# Patient Record
Sex: Male | Born: 1970 | Race: White | Hispanic: No | Marital: Single | Smoking: Former smoker
Health system: Southern US, Community
[De-identification: ages and names within clinical notes are randomized; demographics above are authoritative.]

## PROBLEM LIST (undated history)

## (undated) DIAGNOSIS — J449 Chronic obstructive pulmonary disease, unspecified: Secondary | ICD-10-CM

## (undated) DIAGNOSIS — E119 Type 2 diabetes mellitus without complications: Secondary | ICD-10-CM

## (undated) HISTORY — PX: APPENDECTOMY: SHX54

## (undated) HISTORY — PX: KNEE SURGERY: SHX244

## (undated) HISTORY — DX: Chronic obstructive pulmonary disease, unspecified: J44.9

---

## 2001-01-19 ENCOUNTER — Encounter: Payer: Self-pay | Admitting: Emergency Medicine

## 2001-01-19 ENCOUNTER — Emergency Department (HOSPITAL_COMMUNITY): Admission: EM | Admit: 2001-01-19 | Discharge: 2001-01-19 | Payer: Self-pay | Admitting: Emergency Medicine

## 2001-03-17 ENCOUNTER — Ambulatory Visit (HOSPITAL_BASED_OUTPATIENT_CLINIC_OR_DEPARTMENT_OTHER): Admission: RE | Admit: 2001-03-17 | Discharge: 2001-03-17 | Payer: Self-pay | Admitting: Orthopedic Surgery

## 2001-03-28 ENCOUNTER — Encounter: Admission: RE | Admit: 2001-03-28 | Discharge: 2001-03-28 | Payer: Self-pay | Admitting: Family Medicine

## 2001-07-21 ENCOUNTER — Encounter: Admission: RE | Admit: 2001-07-21 | Discharge: 2001-07-21 | Payer: Self-pay | Admitting: Family Medicine

## 2001-12-09 ENCOUNTER — Emergency Department (HOSPITAL_COMMUNITY): Admission: EM | Admit: 2001-12-09 | Discharge: 2001-12-09 | Payer: Self-pay | Admitting: Emergency Medicine

## 2001-12-09 ENCOUNTER — Encounter: Payer: Self-pay | Admitting: Emergency Medicine

## 2003-08-17 ENCOUNTER — Encounter: Admission: RE | Admit: 2003-08-17 | Discharge: 2003-08-17 | Payer: Self-pay | Admitting: Sports Medicine

## 2003-08-17 ENCOUNTER — Encounter: Admission: RE | Admit: 2003-08-17 | Discharge: 2003-08-17 | Payer: Self-pay | Admitting: Family Medicine

## 2003-09-04 ENCOUNTER — Observation Stay (HOSPITAL_COMMUNITY): Admission: EM | Admit: 2003-09-04 | Discharge: 2003-09-04 | Payer: Self-pay | Admitting: Family Medicine

## 2003-10-01 ENCOUNTER — Encounter: Admission: RE | Admit: 2003-10-01 | Discharge: 2003-10-01 | Payer: Self-pay | Admitting: Family Medicine

## 2003-12-06 ENCOUNTER — Encounter: Admission: RE | Admit: 2003-12-06 | Discharge: 2003-12-06 | Payer: Self-pay | Admitting: Family Medicine

## 2004-03-10 ENCOUNTER — Ambulatory Visit: Payer: Self-pay | Admitting: Family Medicine

## 2004-05-17 ENCOUNTER — Encounter: Admission: RE | Admit: 2004-05-17 | Discharge: 2004-05-17 | Payer: Self-pay | Admitting: Family Medicine

## 2004-05-17 ENCOUNTER — Ambulatory Visit: Payer: Self-pay | Admitting: Sports Medicine

## 2004-06-07 ENCOUNTER — Ambulatory Visit: Payer: Self-pay | Admitting: Sports Medicine

## 2004-07-05 ENCOUNTER — Ambulatory Visit: Payer: Self-pay | Admitting: Sports Medicine

## 2004-08-16 ENCOUNTER — Ambulatory Visit: Payer: Self-pay | Admitting: Sports Medicine

## 2005-02-12 ENCOUNTER — Ambulatory Visit: Payer: Self-pay | Admitting: Family Medicine

## 2005-04-27 ENCOUNTER — Ambulatory Visit: Payer: Self-pay | Admitting: Family Medicine

## 2005-04-27 ENCOUNTER — Encounter: Admission: RE | Admit: 2005-04-27 | Discharge: 2005-04-27 | Payer: Self-pay | Admitting: Family Medicine

## 2005-07-02 ENCOUNTER — Ambulatory Visit: Payer: Self-pay | Admitting: Family Medicine

## 2005-08-09 ENCOUNTER — Ambulatory Visit: Payer: Self-pay | Admitting: Family Medicine

## 2005-09-14 ENCOUNTER — Ambulatory Visit: Payer: Self-pay | Admitting: Family Medicine

## 2005-10-19 ENCOUNTER — Ambulatory Visit: Payer: Self-pay | Admitting: Family Medicine

## 2006-07-11 DIAGNOSIS — G5600 Carpal tunnel syndrome, unspecified upper limb: Secondary | ICD-10-CM

## 2006-07-11 DIAGNOSIS — F329 Major depressive disorder, single episode, unspecified: Secondary | ICD-10-CM

## 2006-07-11 DIAGNOSIS — E669 Obesity, unspecified: Secondary | ICD-10-CM | POA: Insufficient documentation

## 2006-07-11 DIAGNOSIS — F1010 Alcohol abuse, uncomplicated: Secondary | ICD-10-CM | POA: Insufficient documentation

## 2006-07-11 DIAGNOSIS — F17200 Nicotine dependence, unspecified, uncomplicated: Secondary | ICD-10-CM | POA: Insufficient documentation

## 2006-12-16 IMAGING — CR DG FOOT COMPLETE 3+V*L*
3 series · 3 of 3 positions shown · non-contrast
Comparison: none

CLINICAL DATA: Injury left foot.  Pain os calcis region.  
 LEFT FOOT COMPLETE:
 Three views of the left foot show no evidence of fracture, dislocation, or foreign body.  There is small anterior inferior calcaneal spur.

[view not recorded (1 of 3)]
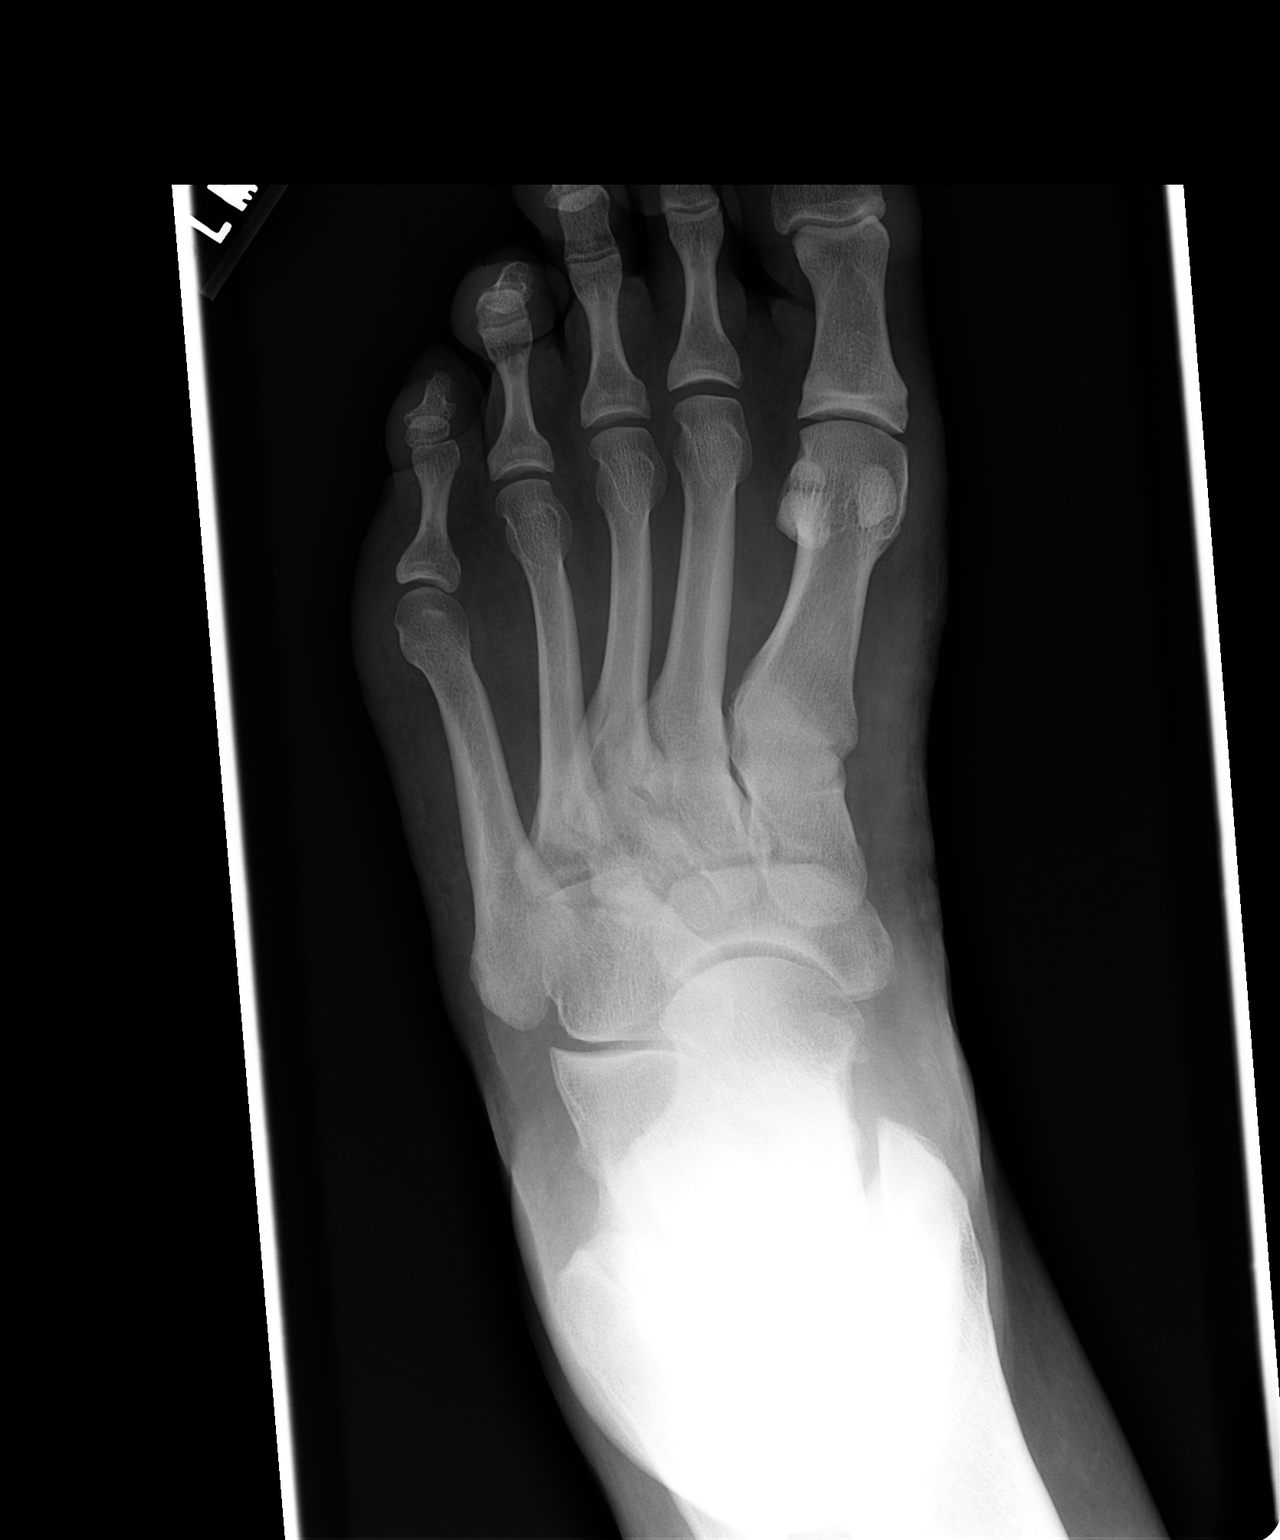

[view not recorded (2 of 3)]
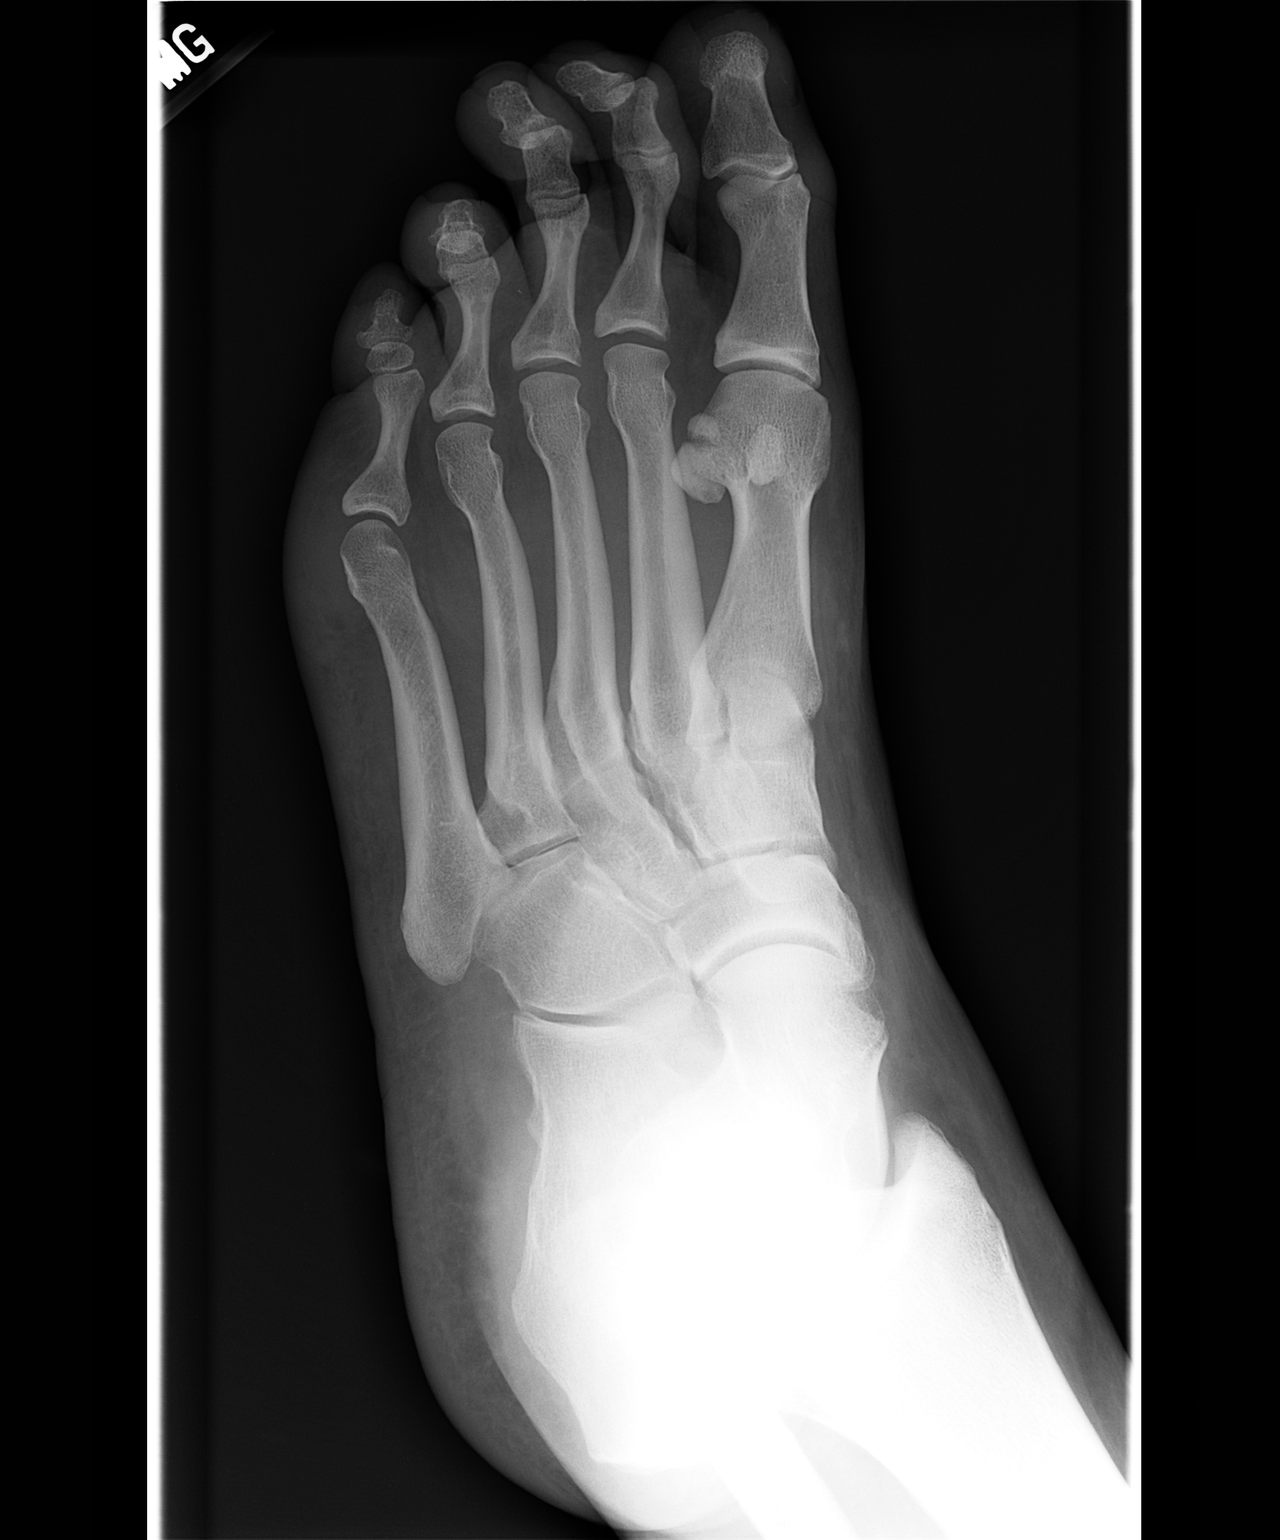

[view not recorded (3 of 3)]
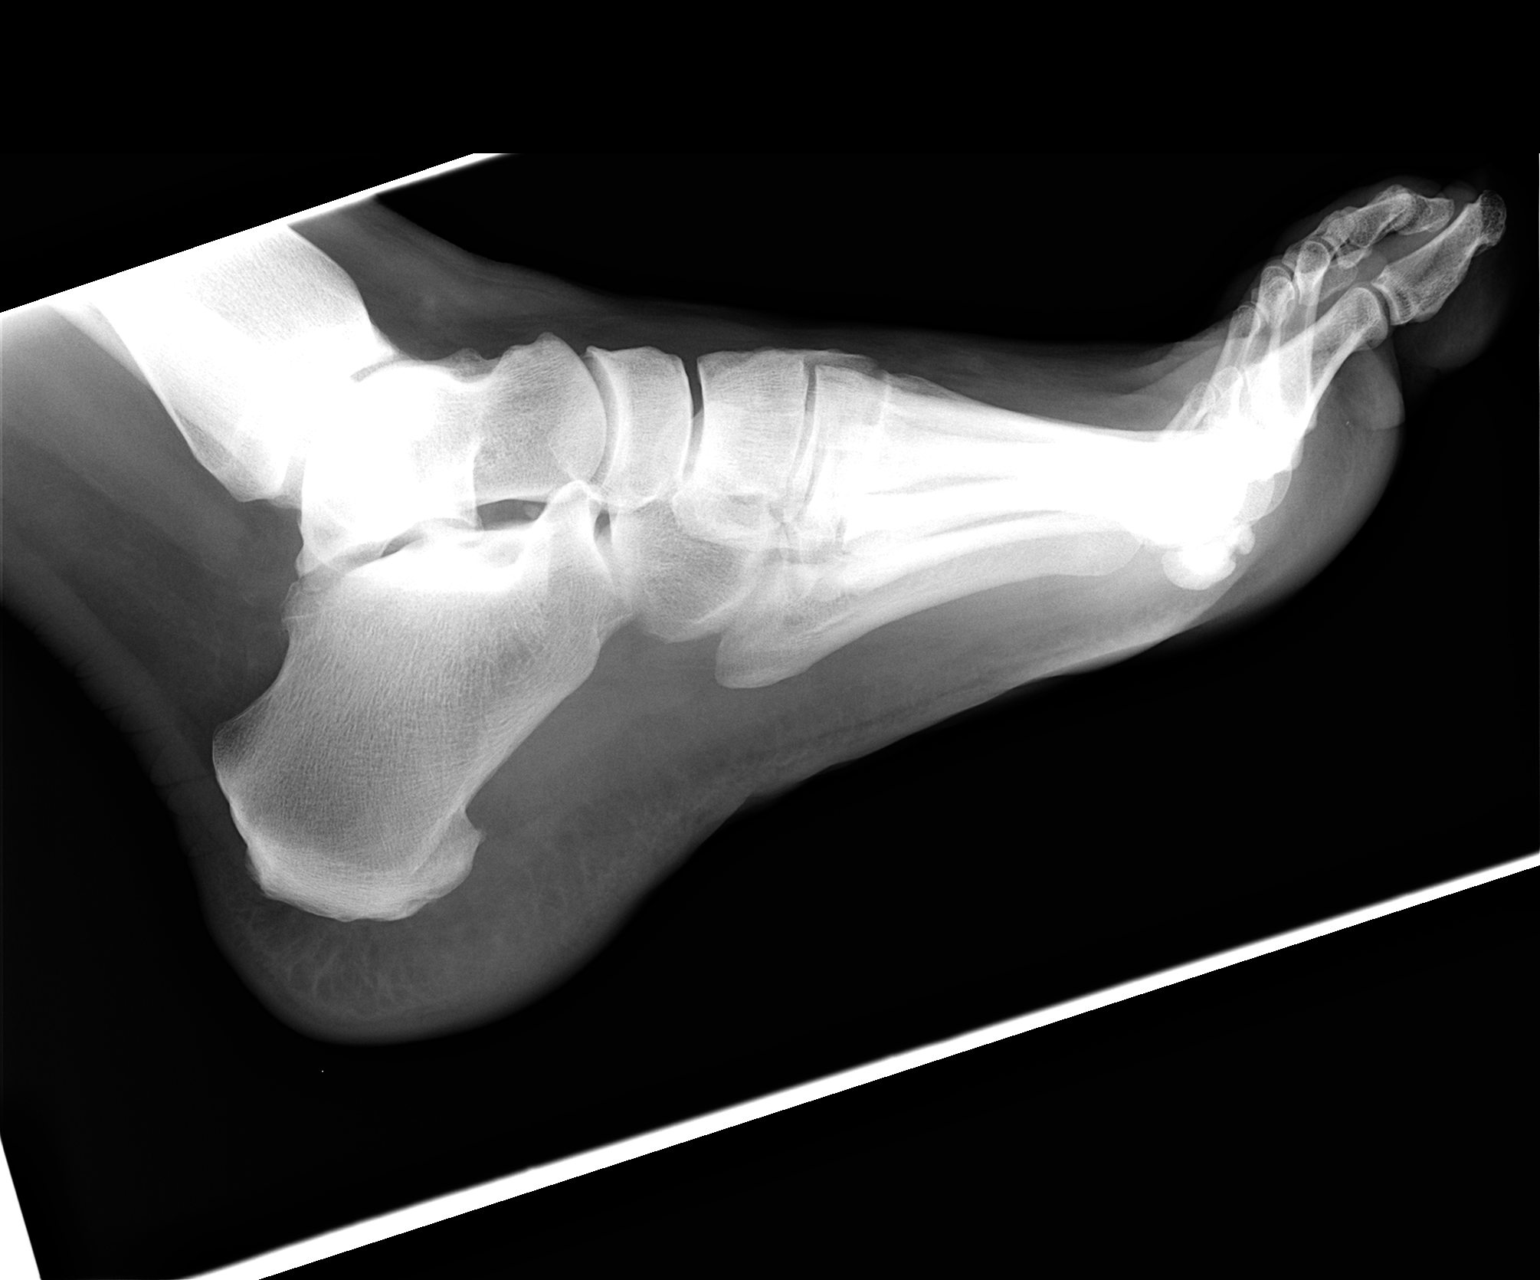

[3 of 3 positions shown; findings below may reference images not displayed]

IMPRESSION: No evidence of fracture, dislocation, or radiopaque foreign body.  Small anterior inferior calcaneal spur.

## 2007-10-24 ENCOUNTER — Ambulatory Visit: Payer: Self-pay | Admitting: Family Medicine

## 2007-10-24 DIAGNOSIS — J309 Allergic rhinitis, unspecified: Secondary | ICD-10-CM | POA: Insufficient documentation

## 2008-02-03 ENCOUNTER — Ambulatory Visit: Payer: Self-pay | Admitting: Family Medicine

## 2008-02-03 ENCOUNTER — Telehealth: Payer: Self-pay | Admitting: *Deleted

## 2008-02-03 DIAGNOSIS — J069 Acute upper respiratory infection, unspecified: Secondary | ICD-10-CM | POA: Insufficient documentation

## 2008-04-14 ENCOUNTER — Emergency Department (HOSPITAL_COMMUNITY): Admission: EM | Admit: 2008-04-14 | Discharge: 2008-04-14 | Payer: Self-pay | Admitting: Emergency Medicine

## 2008-08-20 ENCOUNTER — Ambulatory Visit: Payer: Self-pay | Admitting: Family Medicine

## 2008-08-20 DIAGNOSIS — J45909 Unspecified asthma, uncomplicated: Secondary | ICD-10-CM | POA: Insufficient documentation

## 2008-10-26 ENCOUNTER — Telehealth: Payer: Self-pay | Admitting: Family Medicine

## 2008-10-27 ENCOUNTER — Ambulatory Visit: Payer: Self-pay | Admitting: Family Medicine

## 2008-10-27 ENCOUNTER — Encounter: Admission: RE | Admit: 2008-10-27 | Discharge: 2008-10-27 | Payer: Self-pay | Admitting: Family Medicine

## 2008-10-27 DIAGNOSIS — R05 Cough: Secondary | ICD-10-CM

## 2008-12-19 ENCOUNTER — Emergency Department (HOSPITAL_COMMUNITY): Admission: EM | Admit: 2008-12-19 | Discharge: 2008-12-19 | Payer: Self-pay | Admitting: Emergency Medicine

## 2008-12-21 ENCOUNTER — Emergency Department (HOSPITAL_COMMUNITY): Admission: EM | Admit: 2008-12-21 | Discharge: 2008-12-21 | Payer: Self-pay | Admitting: Emergency Medicine

## 2009-04-04 ENCOUNTER — Emergency Department (HOSPITAL_COMMUNITY): Admission: EM | Admit: 2009-04-04 | Discharge: 2009-04-04 | Payer: Self-pay | Admitting: Emergency Medicine

## 2010-02-02 ENCOUNTER — Encounter: Payer: Self-pay | Admitting: Family Medicine

## 2010-06-13 NOTE — Miscellaneous (Signed)
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 

## 2010-06-15 ENCOUNTER — Encounter: Payer: Self-pay | Admitting: *Deleted

## 2010-09-29 NOTE — Op Note (Signed)
[place]  Patient:    SHELL, YANDOW Visit Number: [id number] MRN: [id number]          Service Type: EMS Location: [facility] Attending Physician:  Osvaldo Human Dictated by:   Alinda Deem, M.D. Proc. Date: 03/03/01 Admit Date:  01/19/2001 Discharge Date: 01/19/2001                             Operative Report  PREOPERATIVE DIAGNOSIS:  Status post right knee patellar dislocation with medial meniscus tear.  POSTOPERATIVE DIAGNOSIS:  Status post right knee patellar dislocation with medial meniscus tear.  OPERATION PERFORMED:  Right knee arthroscopic partial medial meniscectomy, lateral retinacular release and an open medial reefing.  SURGEON:  Alinda Deem, M.D.  ASSISTANT: Dorthula Matas, P.A.-C.  ANESTHESIA:  General endotracheal.  ESTIMATED BLOOD LOSS:  Minimal.  FLUID REPLACEMENT:  1L crystalloid.  DRAINS:  None.  TOURNIQUET TIME:   About 30 minutes.  INDICATIONS FOR PROCEDURE:  The patient is a 40 year old man who sustained a patellar dislocation some months ago with persistent pain, MRI scan showing complete tear of the medial retinaculum and lateral displacement of the patella.  He also had a medial meniscus tear.  Because of persistent pain he is taken for arthroscopic removal of the medial meniscus tear, lateral retinacular release and then repair of the medial retinacular tear.  DESCRIPTION OF PROCEDURE:  The patient was identified by arm band and taken to the operating room at [facility] where the appropriate anesthetic monitors were attached and general endotracheal anesthesia induced with the patient in the supine position.  A lateral post was applied to the table and the right lower extremity prepped and draped in the usual sterile fashion from the ankle to the tourniquet high on the right thigh.  We began the procedure by infiltrating the inferomedial and inferolateral peripatellar  regions with 2 to 3 cc of 0.5% Marcaine with epinephrine solution allowing introduction of the arthroscope through the inferolateral portal and the outflow through the inferomedial portal.  We immediately identified the medial retinacular tear which was complete with lateral subluxation of the patella to the lateral side of the lateral femoral condyle with the knee in extension.  Moving into the medial compartment we identified the medial meniscus tear which was a complex tear of the posterior horn all the way around medially and this was removed with straight biters and a 4.2 Great White sucker shaver.  We also removed an incidental plica to better visualize the patella which did have some very mild chondromalacia.  Lateral retinacular release was deferred to be performed during the open part of the procedure.  The lateral compartment was in excellent condition and so were the cruciate ligaments.  At this point the arthroscopic instruments were removed.  The limb was wrapped with an Esmarch bandage, tourniquet inflated to 350 mmHg and we made a medial parapatellar approach to the knee joint through the skin and subcutaneous tissues just lateral to the medial edge of the patella.  A longitudinal incision about 12 cm in length.  Small bleeders in the skin and subcutaneous tissues identified and cauterized.  The transverse retinaculum was identified and divided and reflected medially exposing the VMO as it came into the patella as well as some abundant scar tissue from the dislocation.  We then made a parapatellar arthrotomy and removed about a quarter inch of scar tissue leaving a cuff  of tendon on either side of the incision.  Because there was some tightness to the lateral retinaculum at this point, we performed an open lateral retinacular release with a #10 blade allowing the patella to center and go to just the medial side of center without difficulty.  The wound was then washed out with  normal saline solution and the medial VMO insertion on the medial retinaculum was closed with running #1 Vicryl suture.  Subcutaneous tissue closed with 0 and 2-0 undyed Vicryl sutures and the skin with running interlocking 3-0 nylon suture.  Prior to the closure the tourniquet was let down to identify and cauterize small bleeders.  A dressing of Xeroform, 4 x 4 dressing sponges, Webril and an Ace wrap and a knee immobilizer applied.  The patient was awakened and taken to the recovery room without difficulty. Dictated by:   Alinda Deem, M.D. Attending Physician:  Osvaldo Human DD:  03/03/01 TD:  03/04/01 Job: [id number]

## 2010-09-29 NOTE — Op Note (Signed)
NAMEEDRIC, Carl Reid NO.:  [id number]   MEDICAL RECORD NO.:  [id number]                   PATIENT TYPE:  INP   LOCATION:  2550                                 FACILITY:  [facility]   PHYSICIAN:  Jimmye Norman III, M.D.               DATE OF BIRTH:  04/13/1971   DATE OF PROCEDURE:  DATE OF DISCHARGE:  09/04/2003                                 OPERATIVE REPORT   PREOPERATIVE DIAGNOSIS:  Perianal abscess, right perianal area.   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   PROCEDURE:  1. Irrigation and drainage of perianal abscess.  2. Fistulotomy.   SURGEON:  Jimmye Norman, M.D.   ASSISTANT:  None.   ANESTHESIA:  General endotracheal.   ESTIMATED BLOOD LOSS:  Less than 20 mL.   COMPLICATIONS:  None.   CONDITION:  Stable.   PACKING:  Approximately two feet of 1/4 inch of Iodoform Nugauze packed in  the fistulous tract.   DISPOSITION:  To home.   INDICATIONS:  The patient is a 40 year old with a new onset of a perianal  abscess who comes in now for drainage.   FINDINGS:  The patient had a walnut size left perianal abscess which tracked  nicely up to a crypt in the right perianal dentate line.   DESCRIPTION OF PROCEDURE:  The patient was taken to the operating room and  placed on the table in the supine position. After an adequate endotracheal  anesthetic was administered, he was placed in the lithotomy, prepped and  draped in the usual sterile manner.   As we placed pressure on the abscess cavity you could see pus coming out of  the crypt at the base of the dentate line.  We opened the abscess cavity  with a 15 blade and subsequently used a hemostat clamp in order to dissect  up to the opening in the crypt.  A rectal probe was used which opened up in  to the tract and we cauterized along this doing a fistulotomy.   The total length of the fistulotomy tract and incised abscess cavity was  about 5 cm.   We scraped out most of the fibrinous tissue and then  subsequently in that plane a mucosal plane with Iodoform and Nugauze.  Approximately 2 feet of 1/4 inch was used.  A Dibucaine soaked Gelfoam was  then placed in the anus and a dressing applied.                                               Kathrin Ruddy, M.D.    JW/MEDQ  D:  09/04/2003  T:  09/05/2003  Job:  [id number]

## 2011-02-16 LAB — POCT CARDIAC MARKERS
CKMB, poc: 2.7 ng/mL (ref 1.0–8.0)
Troponin i, poc: <0.05 ng/mL (ref 0.00–0.09)

## 2014-05-24 ENCOUNTER — Emergency Department (HOSPITAL_COMMUNITY): Payer: Self-pay

## 2014-05-24 ENCOUNTER — Emergency Department (HOSPITAL_COMMUNITY)
Admission: EM | Admit: 2014-05-24 | Discharge: 2014-05-24 | Disposition: A | Discharge status: Home / Self Care | Payer: Self-pay | Attending: Emergency Medicine | Admitting: Emergency Medicine

## 2014-05-24 ENCOUNTER — Encounter (HOSPITAL_COMMUNITY): Payer: Self-pay | Admitting: Family Medicine

## 2014-05-24 DIAGNOSIS — Z720 Tobacco use: Secondary | ICD-10-CM | POA: Insufficient documentation

## 2014-05-24 DIAGNOSIS — R059 Cough, unspecified: Secondary | ICD-10-CM

## 2014-05-24 DIAGNOSIS — Z7951 Long term (current) use of inhaled steroids: Secondary | ICD-10-CM | POA: Insufficient documentation

## 2014-05-24 DIAGNOSIS — R05 Cough: Secondary | ICD-10-CM | POA: Insufficient documentation

## 2014-05-24 DIAGNOSIS — Z79899 Other long term (current) drug therapy: Secondary | ICD-10-CM | POA: Insufficient documentation

## 2014-05-24 DIAGNOSIS — J029 Acute pharyngitis, unspecified: Secondary | ICD-10-CM | POA: Insufficient documentation

## 2014-05-24 LAB — BASIC METABOLIC PANEL
Anion gap: 10 (ref 5–15)
BUN: 12 mg/dL (ref 6–23)
CALCIUM: 8.8 mg/dL (ref 8.4–10.5)
CHLORIDE: 100 meq/L (ref 96–112)
CO2: 23 mmol/L (ref 19–32)
CREATININE: 1.12 mg/dL (ref 0.50–1.35)
GFR calc Af Amer: >90 mL/min (ref >90)
GFR calc non Af Amer: 78 mL/min — ABNORMAL LOW (ref >90)
GLUCOSE: 151 mg/dL — AB (ref 70–99)
POTASSIUM: 3.5 mmol/L (ref 3.5–5.1)
SODIUM: 133 mmol/L — AB (ref 135–145)

## 2014-05-24 LAB — CBC
HCT: 46.3 % (ref 39.0–52.0)
Hemoglobin: 16.3 g/dL (ref 13.0–17.0)
MCH: 31 pg (ref 26.0–34.0)
MCHC: 35.2 g/dL (ref 30.0–36.0)
MCV: 88 fL (ref 78.0–100.0)
Platelets: 130 10*3/uL — ABNORMAL LOW (ref 150–400)
RBC: 5.26 MIL/uL (ref 4.22–5.81)
RDW: 13.6 % (ref 11.5–15.5)
WBC: 9.1 10*3/uL (ref 4.0–10.5)

## 2014-05-24 MED ORDER — ACETAMINOPHEN 500 MG PO TABS
1000.0000 mg | ORAL_TABLET | Freq: Once | ORAL | Status: AC
  Administered 2014-05-24: 1000 mg via ORAL
  Filled 2014-05-24: qty 2

## 2014-05-24 MED ORDER — ALBUTEROL SULFATE HFA 108 (90 BASE) MCG/ACT IN AERS
2.0000 | INHALATION_SPRAY | RESPIRATORY_TRACT | Status: DC | PRN
  Administered 2014-05-24: 2 via RESPIRATORY_TRACT
  Filled 2014-05-24: qty 6.7

## 2014-05-24 MED ORDER — IPRATROPIUM BROMIDE 0.02 % IN SOLN
0.5000 mg | Freq: Once | RESPIRATORY_TRACT | Status: AC
  Administered 2014-05-24: 0.5 mg via RESPIRATORY_TRACT
  Filled 2014-05-24: qty 2.5

## 2014-05-24 MED ORDER — AZITHROMYCIN 250 MG PO TABS: 250.0000 mg | ORAL_TABLET | Freq: Every day | ORAL | Status: DC

## 2014-05-24 MED ORDER — PREDNISONE 20 MG PO TABS: 40.0000 mg | ORAL_TABLET | Freq: Every day | ORAL | Status: DC

## 2014-05-24 MED ORDER — ALBUTEROL SULFATE (2.5 MG/3ML) 0.083% IN NEBU
5.0000 mg | INHALATION_SOLUTION | Freq: Once | RESPIRATORY_TRACT | Status: AC
  Administered 2014-05-24: 5 mg via RESPIRATORY_TRACT
  Filled 2014-05-24: qty 6

## 2014-05-24 NOTE — ED Provider Notes (Signed)
CSN: [id number]     Arrival date & time 05/24/14  1301 History   First MD Initiated Contact with Patient 05/24/14 1325     Chief Complaint  Patient presents with  . 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 Patient states symptoms have been ongoing for the past couple of days. He has been taking OTC medications with no relief. He denies any fevers, chills, chest pain, or abdominal pain. There are no aggravating factors. Patient denies any productive cough.  The history is provided by the patient. No language interpreter was used.    History reviewed. No pertinent past medical history. History reviewed. No pertinent past surgical history. History reviewed. No pertinent family history. History  Substance Use Topics  . Smoking status: Current Every Day Smoker  . Smokeless tobacco: Not on file  . Alcohol Use: Yes    Review of Systems  Constitutional: Positive for chills. Negative for fever.  HENT: Positive for postnasal drip, rhinorrhea, sinus pressure, sneezing and sore throat.   Respiratory: Positive for cough. Negative for shortness of breath.   Cardiovascular: Negative for chest pain.  Gastrointestinal: Negative for nausea, vomiting, abdominal pain, diarrhea and constipation.  Genitourinary: Negative for dysuria.  All other systems reviewed and are negative.     Allergies  Review of patient's allergies indicates no known allergies.  Home Medications   Prior to Admission medications   Medication Sig Start Date End Date Taking? Authorizing Provider  albuterol (PROVENTIL HFA) 108 (90 BASE) MCG/ACT inhaler Inhale 2 puffs into the lungs every 4 (four) hours as needed. for wheezing     Historical Provider, MD  cetirizine (ZYRTEC)  10 MG tablet Take 10 mg by mouth daily. For allergies     Historical Provider, MD  fluticasone (FLOVENT HFA) 110 MCG/ACT inhaler Inhale 2 puffs into the lungs 2 (two) times daily. For 1 wk then one puff daily thereafter     Historical Provider, MD  predniSONE (DELTASONE) 20 MG tablet Take 20 mg by mouth daily. For 5 days     Historical Provider, MD  varenicline (CHANTIX STARTING MONTH PAK) 0.5 MG X 11 & 1 MG X 42 tablet Take by mouth as directed. Take one 0.5mg  tablet by mouth once daily for 3 days, then increase to one 0.5mg  tablet twice daily for 3 days, then increase to one 1mg  tablet twice daily.     Historical Provider, MD   BP 139/121 mmHg  Pulse 119  Resp 20  SpO2 95% Physical Exam  Constitutional: He appears well-developed and well-nourished. No distress.  HENT:  Head: Normocephalic.  Right Ear: External ear normal.  Left Ear: External ear normal.  Mildly erythematous, no tonsillar exudate, no abscess, no stridor, uvula is midline  TMs clear bilaterally  Eyes: Conjunctivae and EOM are normal. Pupils are equal, round, and reactive to light.  Neck: Normal range of motion. Neck supple.  Cardiovascular: Normal rate, regular rhythm and normal heart sounds.  Exam reveals no gallop and no friction rub.   No murmur heard. Pulmonary/Chest: Effort normal and breath sounds normal. No stridor. No respiratory distress. He has no wheezes. He has no rales. He exhibits no tenderness.  CTAB  Abdominal: Soft. He exhibits no distension. There is no tenderness.  Musculoskeletal: Normal range of motion. He exhibits no tenderness.  Neurological: He is alert.  Skin: Skin is warm and dry. No rash noted. He is not diaphoretic.  Psychiatric: He has a normal mood and affect. His behavior is normal. Judgment and thought content normal.  Nursing note and vitals reviewed.   ED Course  Procedures (including critical care time) Results for orders placed or performed during the hospital encounter of  05/24/14  CBC  Result Value Ref Range   WBC 9.1 4.0 - 10.5 K/uL   RBC 5.26 4.22 - 5.81 MIL/uL   Hemoglobin 16.3 13.0 - 17.0 g/dL   HCT 40.9 81.1 - 91.4 %   MCV 88.0 78.0 - 100.0 fL   MCH 31.0 26.0 - 34.0 pg   MCHC 35.2 30.0 - 36.0 g/dL   RDW 78.2 95.6 - 21.3 %   Platelets 130 (L) 150 - 400 K/uL  Basic metabolic panel  Result Value Ref Range   Sodium 133 (L) 135 - 145 mmol/L   Potassium 3.5 3.5 - 5.1 mmol/L   Chloride 100 96 - 112 mEq/L   CO2 23 19 - 32 mmol/L   Glucose, Bld 151 (H) 70 - 99 mg/dL   BUN 12 6 - 23 mg/dL   Creatinine, Ser 0.86 0.50 - 1.35 mg/dL   Calcium 8.8 8.4 - 57.8 mg/dL   GFR calc non Af Amer 78 (L) >90 mL/min   GFR calc Af Amer >90 >90 mL/min   Anion gap 10 5 - 15   Dg Chest 2 View  05/24/2014   CLINICAL DATA:  Cough, shortness of breath, chest pain, dizziness for 3 days.  EXAM: CHEST  2 VIEW  COMPARISON:  10/27/2008  FINDINGS: The heart size and mediastinal contours are within normal limits. Both lungs are clear. The visualized skeletal structures are unremarkable.  IMPRESSION: No active cardiopulmonary disease.   Electronically Signed   By: Charlett Nose M.D.   On: 05/24/2014 15:29     Imaging Review Dg Chest 2 View  05/24/2014   CLINICAL DATA:  Cough, shortness of breath, chest pain, dizziness for 3 days.  EXAM: CHEST  2 VIEW  COMPARISON:  10/27/2008  FINDINGS: The heart size and mediastinal contours are within normal limits. Both lungs are clear. The visualized skeletal structures are unremarkable.  IMPRESSION: No active cardiopulmonary disease.   Electronically Signed   By: Charlett Nose M.D.   On: 05/24/2014 15:29     EKG Interpretation None      MDM   Final diagnoses:  Cough    Patient presents emergency department with chief complaint of cough and nasal congestion 2-3 days. States that he is tried OTC medications with no relief. No chest pain. No shortness of breath.  Patient feels improved after breathing treatment. Ambulates maintaining  greater than 90% O2 saturation. Given Tylenol in the emergency department will discharge to home with prednisone, Z-Pak, and inhaler. Recommend outpatient follow-up. Return precautions given. Patient stands and agrees with the plan.  Tachy, but had albuterol.  Feels well when ambulating.  Filed Vitals:   05/24/14 1541  BP: 108/56  Pulse: 121  Temp:   Resp: [address] PA-C 05/24/14 1545  Toy Cookey, MD 05/25/14 [phone]

## 2014-05-24 NOTE — ED Notes (Signed)
Pt here for cough and congestion x a few days. sts not getting better.

## 2014-05-24 NOTE — Discharge Instructions (Signed)
Cough, Adult   A cough is a reflex. It helps you clear your throat and airways. A cough can help heal your body. A cough can last 2 or 3 weeks (acute) or may last more than 8 weeks (chronic). Some common causes of a cough can include an infection, allergy, or a cold.  HOME CARE  · Only take medicine as told by your doctor.  · If given, take your medicines (antibiotics) as told. Finish them even if you start to feel better.  · Use a cold steam vaporizer or humidifier in your home. This can help loosen thick spit (secretions).  · Sleep so you are almost sitting up (semi-upright). Use pillows to do this. This helps reduce coughing.  · Rest as needed.  · Stop smoking if you smoke.  GET HELP RIGHT AWAY IF:  · You have yellowish-white fluid (pus) in your thick spit.  · Your cough gets worse.  · Your medicine does not reduce coughing, and you are losing sleep.  · You cough up blood.  · You have trouble breathing.  · Your pain gets worse and medicine does not help.  · You have a fever.  MAKE SURE YOU:   · Understand these instructions.  · Will watch your condition.  · Will get help right away if you are not doing well or get worse.  Document Released: 01/11/2011 Document Revised: 09/14/2013 Document Reviewed: 01/11/2011  ExitCare® Patient Information ©2015 [employer]. This information is not intended to replace advice given to you by your health care provider. Make sure you discuss any questions you have with your health care provider.

## 2014-05-24 NOTE — ED Notes (Signed)
Pt did not answer x 1 

## 2014-09-24 ENCOUNTER — Emergency Department (HOSPITAL_COMMUNITY)
Admission: EM | Admit: 2014-09-24 | Discharge: 2014-09-24 | Disposition: A | Discharge status: Home / Self Care | Payer: Self-pay | Attending: Emergency Medicine | Admitting: Emergency Medicine

## 2014-09-24 ENCOUNTER — Encounter (HOSPITAL_COMMUNITY): Payer: Self-pay | Admitting: *Deleted

## 2014-09-24 DIAGNOSIS — Z79899 Other long term (current) drug therapy: Secondary | ICD-10-CM | POA: Insufficient documentation

## 2014-09-24 DIAGNOSIS — E119 Type 2 diabetes mellitus without complications: Secondary | ICD-10-CM | POA: Insufficient documentation

## 2014-09-24 DIAGNOSIS — N342 Other urethritis: Secondary | ICD-10-CM | POA: Insufficient documentation

## 2014-09-24 DIAGNOSIS — Z720 Tobacco use: Secondary | ICD-10-CM | POA: Insufficient documentation

## 2014-09-24 HISTORY — DX: Type 2 diabetes mellitus without complications: E11.9

## 2014-09-24 LAB — CBG MONITORING, ED: Glucose-Capillary: 221 mg/dL — ABNORMAL HIGH (ref 65–99)

## 2014-09-24 MED ORDER — AZITHROMYCIN 250 MG PO TABS
1000.0000 mg | ORAL_TABLET | Freq: Once | ORAL | Status: AC
  Administered 2014-09-24: 1000 mg via ORAL
  Filled 2014-09-24: qty 4

## 2014-09-24 MED ORDER — CEFTRIAXONE SODIUM 250 MG IJ SOLR
250.0000 mg | Freq: Once | INTRAMUSCULAR | Status: AC
  Administered 2014-09-24: 250 mg via INTRAMUSCULAR
  Filled 2014-09-24: qty 250

## 2014-09-24 NOTE — Discharge Instructions (Signed)
You were treated today with intramuscular Rocephin and oral Zithromax. Please see the health department for additional hepatitis, HIV, and syphilis testing. Please refrain from all sexual activity for the next 7 days. Urethritis Urethritis is an inflammation of the tube through which urine exits your bladder (urethra).  CAUSES Urethritis is often caused by an infection in your urethra. The infection can be viral, like herpes. The infection can also be bacterial, like gonorrhea. RISK FACTORS Risk factors of urethritis include:  Having sex without using a condom.  Having multiple sexual partners.  Having poor hygiene. SIGNS AND SYMPTOMS Symptoms of urethritis are less noticeable in women than in men. These symptoms include:  Burning feeling when you urinate (dysuria).  Discharge from your urethra.  Blood in your urine (hematuria).  Urinating more than usual. DIAGNOSIS  To confirm a diagnosis of urethritis, your health care provider will do the following:  Ask about your sexual history.  Perform a physical exam.  Have you provide a sample of your urine for lab testing.  Use a cotton swab to gently collect a sample from your urethra for lab testing. TREATMENT  It is important to treat urethritis. Depending on the cause, untreated urethritis may lead to serious genital infections and possibly infertility. Urethritis caused by a bacterial infection is treated with antibiotic medicine. All sexual partners must be treated.  HOME CARE INSTRUCTIONS  Do not have sex until the test results are known and treatment is completed, even if your symptoms go away before you finish treatment.  If you were prescribed an antibiotic, finish it all even if you start to feel better. SEEK MEDICAL CARE IF:   Your symptoms are not improved in 3 days.  Your symptoms are getting worse.  You develop abdominal pain or pelvic pain (in women).  You develop joint pain.  You have a fever. SEEK  IMMEDIATE MEDICAL CARE IF:   You have severe pain in the belly, back, or side.  You have repeated vomiting. MAKE SURE YOU:  Understand these instructions.  Will watch your condition.  Will get help right away if you are not doing well or get worse. Document Released: 10/24/2000 Document Revised: 09/14/2013 Document Reviewed: 12/29/2012 [facility] Patient Information 2015 [place], [place]. This information is not intended to replace advice given to you by your health care provider. Make sure you discuss any questions you have with your health care provider.

## 2014-09-24 NOTE — ED Provider Notes (Signed)
CSN: [id number]     Arrival date & time 09/24/14  1158 History   First MD Initiated Contact with Patient 09/24/14 1250     No chief complaint on file.    (Consider location/radiation/quality/duration/timing/severity/associated sxs/prior Treatment) HPI Comments: Patient presents to the emergency department with a complaint of "I think I have gonorrhea".  Patient states that he has been having some burning and possible discharge from the penis for nearly 2 weeks. He states that his girlfriend notified him this week that she had an STD, and that he should go get checked. Patient presents to the emergency department for evaluation. The patient denies fever. He denies any unusual rash. It is of note that he is diabetic. There no other  no challenges to the immune system.  The history is provided by the patient.    Past Medical History  Diagnosis Date  . Diabetes mellitus without complication    Past Surgical History  Procedure Laterality Date  . Knee surgery    . Appendectomy     History reviewed. No pertinent family history. History  Substance Use Topics  . Smoking status: Current Every Day Smoker  . Smokeless tobacco: Not on file  . Alcohol Use: Yes    Review of Systems  Constitutional: Negative for activity change.       All ROS Neg except as noted in HPI  HENT: Negative for nosebleeds.   Eyes: Negative for photophobia and discharge.  Respiratory: Negative for cough, shortness of breath and wheezing.   Cardiovascular: Negative for chest pain and palpitations.  Gastrointestinal: Negative for abdominal pain and blood in stool.  Genitourinary: Positive for dysuria and discharge. Negative for frequency, hematuria and testicular pain.  Musculoskeletal: Negative for back pain, arthralgias and neck pain.  Skin: Negative.   Neurological: Negative for dizziness, seizures and speech difficulty.  Psychiatric/Behavioral: Negative for hallucinations and confusion.      Allergies   Review of patient's allergies indicates no known allergies.  Home Medications   Prior to Admission medications   Medication Sig Start Date End Date Taking? Authorizing Provider  albuterol (PROVENTIL HFA) 108 (90 BASE) MCG/ACT inhaler Inhale 2 puffs into the lungs every 4 (four) hours as needed. for wheezing    Yes Historical Provider, MD  azithromycin (ZITHROMAX Z-PAK) 250 MG tablet Take 1 tablet (250 mg total) by mouth daily. 500mg  PO day 1, then 250mg  PO days 205 Patient not taking: Reported on 09/24/2014 05/24/14   Roxy Horsemanobert Browning, PA-C  predniSONE (DELTASONE) 20 MG tablet Take 2 tablets (40 mg total) by mouth daily. Patient not taking: Reported on 09/24/2014 05/24/14   Roxy Horsemanobert Browning, PA-C   BP 145/92 mmHg  Pulse 96  Temp(Src) 97.7 F (36.5 C) (Oral)  Resp 22  Ht 6\' 2"  (1.88 m)  Wt 365 lb (165.563 kg)  BMI 46.84 kg/m2  SpO2 97% Physical Exam  Constitutional: He is oriented to person, place, and time. He appears well-developed and well-nourished.  Non-toxic appearance.  HENT:  Head: Normocephalic.  Right Ear: Tympanic membrane and external ear normal.  Left Ear: Tympanic membrane and external ear normal.  Eyes: EOM and lids are normal. Pupils are equal, round, and reactive to light.  Neck: Normal range of motion. Neck supple. Carotid bruit is not present.  Cardiovascular: Normal rate, regular rhythm, normal heart sounds, intact distal pulses and normal pulses.   Pulmonary/Chest: Breath sounds normal. No respiratory distress.  Abdominal: Soft. Bowel sounds are normal. There is no tenderness. There is no guarding.  Genitourinary:  There no palpable nodes in the inguinal area on the right or the left. There is no evidence of inguinal hernia. The testicles are descended bilaterally. There is no tenderness or swelling involving the testicles. There is no unusual rash in the groin area. There is a small amount of white discharge at the opening of the urethra. There is increased redness  at the meatus of the urethra.  Musculoskeletal: Normal range of motion.  Lymphadenopathy:       Head (right side): No submandibular adenopathy present.       Head (left side): No submandibular adenopathy present.    He has no cervical adenopathy.  Neurological: He is alert and oriented to person, place, and time. He has normal strength. No cranial nerve deficit or sensory deficit.  Skin: Skin is warm and dry.  Psychiatric: He has a normal mood and affect. His speech is normal.  Nursing note and vitals reviewed.   ED Course  Procedures (including critical care time) Labs Review Labs Reviewed  CBG MONITORING, ED - Abnormal; Notable for the following:    Glucose-Capillary 221 (*)    All other components within normal limits  GC/CHLAMYDIA PROBE AMP ([place])    Imaging Review No results found.   EKG Interpretation None      MDM  Vital signs within normal limits. Patient was treated for possible urethritis with intramuscular Rocephin and oral Zithromax. He [place] The patient is encouraged to be seen at the health department for additional testing including hepatitis, HIV, and syphilis. Patient acknowledges understanding of the discharge instructions. He has been asked to refrain from all sexual activity for the next 7 days.    Final diagnoses:  Urethritis    *I have reviewed nursing notes, vital signs, and all appropriate lab and imaging results for this patient.Carl Reid**    Idell Hissong, PA-C 09/24/14 1344  Vanetta MuldersScott Zackowski, MD 09/29/14 1447

## 2014-09-24 NOTE — ED Notes (Signed)
Dysuria, " I think I have gonorrhea".

## 2014-09-27 LAB — GC/CHLAMYDIA PROBE AMP (~~LOC~~) NOT AT ARMC
CHLAMYDIA, DNA PROBE: NEGATIVE
NEISSERIA GONORRHEA: NEGATIVE

## 2014-10-16 ENCOUNTER — Telehealth: Payer: Self-pay | Admitting: Emergency Medicine

## 2014-10-16 ENCOUNTER — Emergency Department (HOSPITAL_COMMUNITY)
Admission: EM | Admit: 2014-10-16 | Discharge: 2014-10-16 | Disposition: A | Discharge status: Home / Self Care | Payer: Self-pay | Attending: Emergency Medicine | Admitting: Emergency Medicine

## 2014-10-16 ENCOUNTER — Encounter (HOSPITAL_COMMUNITY): Payer: Self-pay | Admitting: *Deleted

## 2014-10-16 DIAGNOSIS — E119 Type 2 diabetes mellitus without complications: Secondary | ICD-10-CM | POA: Insufficient documentation

## 2014-10-16 DIAGNOSIS — Z720 Tobacco use: Secondary | ICD-10-CM | POA: Insufficient documentation

## 2014-10-16 DIAGNOSIS — Z79899 Other long term (current) drug therapy: Secondary | ICD-10-CM | POA: Insufficient documentation

## 2014-10-16 DIAGNOSIS — N342 Other urethritis: Secondary | ICD-10-CM | POA: Insufficient documentation

## 2014-10-16 DIAGNOSIS — N390 Urinary tract infection, site not specified: Secondary | ICD-10-CM

## 2014-10-16 DIAGNOSIS — Z7952 Long term (current) use of systemic steroids: Secondary | ICD-10-CM | POA: Insufficient documentation

## 2014-10-16 DIAGNOSIS — Z792 Long term (current) use of antibiotics: Secondary | ICD-10-CM | POA: Insufficient documentation

## 2014-10-16 LAB — URINALYSIS, ROUTINE W REFLEX MICROSCOPIC
Bilirubin Urine: NEGATIVE
Glucose, UA: 100 mg/dL — AB
KETONES UR: NEGATIVE mg/dL
Nitrite: NEGATIVE
Protein, ur: NEGATIVE mg/dL
Urobilinogen, UA: 0.2 mg/dL (ref 0.0–1.0)
pH: 5 (ref 5.0–8.0)

## 2014-10-16 LAB — URINE MICROSCOPIC-ADD ON

## 2014-10-16 MED ORDER — METRONIDAZOLE 500 MG PO TABS
2000.0000 mg | ORAL_TABLET | Freq: Once | ORAL | Status: AC
  Administered 2014-10-16: 2000 mg via ORAL
  Filled 2014-10-16: qty 4

## 2014-10-16 MED ORDER — LIDOCAINE HCL (PF) 1 % IJ SOLN
INTRAMUSCULAR | Status: AC
  Filled 2014-10-16: qty 5

## 2014-10-16 MED ORDER — CEFTRIAXONE SODIUM 250 MG IJ SOLR
250.0000 mg | INTRAMUSCULAR | Status: DC
  Administered 2014-10-16: 250 mg via INTRAMUSCULAR
  Filled 2014-10-16: qty 250

## 2014-10-16 MED ORDER — METRONIDAZOLE 500 MG PO TABS: 2000.0000 mg | ORAL_TABLET | Freq: Once | ORAL | Status: DC

## 2014-10-16 MED ORDER — AZITHROMYCIN 250 MG PO TABS
1000.0000 mg | ORAL_TABLET | Freq: Once | ORAL | Status: AC
  Administered 2014-10-16: 1000 mg via ORAL
  Filled 2014-10-16: qty 4

## 2014-10-16 MED ORDER — CEFIXIME 400 MG PO CAPS: 400.0000 mg | ORAL_CAPSULE | Freq: Every day | ORAL | Status: DC

## 2014-10-16 NOTE — ED Notes (Signed)
Treated on 09/24/14 for urethritis w/Rocephin and Zithromax. Symptoms resolved but returned yesterday AM.  Burning, stinging w/urination. Denies hematuria, pelvic pain or groin pain.

## 2014-10-16 NOTE — Telephone Encounter (Signed)
ID verified, pt called requesting changed in RX Suprax due to cost. Dr Fayrene FearingJames prescribing EDP  notified. New order given for Bactrim DS one PO BID x 10 days. Bactrim RX called to [facility] in [place].

## 2014-10-16 NOTE — Discharge Instructions (Signed)
Your urine suggests an infection of bladder, or urethera.  Your last testing for Gonorrhea and Chlamydia, were both negative.  You are being tested for Gonorrhea, and Chlamydia again.   Your urine is also being cultured for any bacterial infection.  With the medications given in the emergency room, a prescription you are being treated for both bladder infection, and STD.  He you are not improved in 48-72 hours or if her symptoms become worse, recheck here.

## 2014-10-16 NOTE — ED Provider Notes (Signed)
CSN: [id number]     Arrival date & time 10/16/14  1458 History   First MD Initiated Contact with Patient 10/16/14 1521     Chief Complaint  Patient presents with  . Dysuria      HPI  Patient presents evaluation of pain with urination, and urethral discharge. Seen and evaluated on 513. Had a swab that was negative ultimately, for GC and chlamydia. Was given I am Rocephin and Zithromax by mouth. States his symptoms got better for 4-5 days but have recurred. He has a girlfriend states he is monogamous. He states that he has not had intercourse since being treated. His symptoms recurred 5 or 6 days ago. He states are some occasional yellow discharge in his shorts. He does have urinary frequency that is new. He now gets up 2-3 times a night to urinate which has been different for him in the last few weeks. No back pain or flank pain. No nausea or vomiting. No fevers or chills. No hematuria.  No testicular pain or swelling. No hesitancy or if culture starting stream or other prostate symptoms.  Past Medical History  Diagnosis Date  . Diabetes mellitus without complication    Past Surgical History  Procedure Laterality Date  . Knee surgery    . Appendectomy     History reviewed. No pertinent family history. History  Substance Use Topics  . Smoking status: Current Every Day Smoker  . Smokeless tobacco: Not on file  . Alcohol Use: Yes    Review of Systems  Constitutional: Negative for fever, chills, diaphoresis, appetite change and fatigue.  HENT: Negative for mouth sores, sore throat and trouble swallowing.   Eyes: Negative for visual disturbance.  Respiratory: Negative for cough, chest tightness, shortness of breath and wheezing.   Cardiovascular: Negative for chest pain.  Gastrointestinal: Negative for nausea, vomiting, abdominal pain, diarrhea and abdominal distention.  Endocrine: Negative for polydipsia, polyphagia and polyuria.  Genitourinary: Positive for dysuria, urgency,  frequency, discharge and penile pain. Negative for hematuria, decreased urine volume, penile swelling, scrotal swelling and testicular pain.  Musculoskeletal: Negative for gait problem.  Skin: Negative for color change, pallor and rash.  Neurological: Negative for dizziness, syncope, light-headedness and headaches.  Hematological: Does not bruise/bleed easily.  Psychiatric/Behavioral: Negative for behavioral problems and confusion.      Allergies  Review of patient's allergies indicates no known allergies.  Home Medications   Prior to Admission medications   Medication Sig Start Date End Date Taking? Authorizing Provider  albuterol (PROVENTIL HFA) 108 (90 BASE) MCG/ACT inhaler Inhale 2 puffs into the lungs every 4 (four) hours as needed. for wheezing    Yes Historical Provider, MD  azithromycin (ZITHROMAX Z-PAK) 250 MG tablet Take 1 tablet (250 mg total) by mouth daily.  PO day 1, then  PO days 205 Patient not taking: Reported on 09/24/2014 05/24/14   Roxy Horseman, PA-C  Cefixime (SUPRAX) 400 MG CAPS capsule Take 1 capsule (400 mg total) by mouth daily. 10/16/14   Rolland Porter, MD  predniSONE (DELTASONE) 20 MG tablet Take 2 tablets (40 mg total) by mouth daily. Patient not taking: Reported on 09/24/2014 05/24/14   Roxy Horseman, PA-C   BP 139/89 mmHg  Pulse 100  Temp(Src) 98.6 F (37 C) (Oral)  Resp 22  SpO2 97% Physical Exam  Constitutional: He is oriented to person, place, and time. He appears well-developed and well-nourished. No distress.  HENT:  Head: Normocephalic.  Eyes: Conjunctivae are normal. Pupils are equal, round, and reactive  to light. No scleral icterus.  Neck: Normal range of motion. Neck supple. No thyromegaly present.  Cardiovascular: Normal rate and regular rhythm.  Exam reveals no gallop and no friction rub.   No murmur heard. Pulmonary/Chest: Effort normal and breath sounds normal. No respiratory distress. He has no wheezes. He has no rales.    Abdominal: Soft. Bowel sounds are normal. He exhibits no distension. There is no tenderness. There is no rebound.  Genitourinary:     Musculoskeletal: Normal range of motion.  Neurological: He is alert and oriented to person, place, and time.  Skin: Skin is warm and dry. No rash noted.  Psychiatric: He has a normal mood and affect. His behavior is normal.    ED Course  Procedures (including critical care time) Labs Review Labs Reviewed  URINALYSIS, ROUTINE W REFLEX MICROSCOPIC (NOT AT [facility]) - Abnormal; Notable for the following:    APPearance CLOUDY (*)    Specific Gravity, Urine >1.030 (*)    Glucose, UA 100 (*)    Hgb urine dipstick SMALL (*)    Leukocytes, UA SMALL (*)    All other components within normal limits  URINE MICROSCOPIC-ADD ON - Abnormal; Notable for the following:    Squamous Epithelial / LPF FEW (*)    Bacteria, UA MANY (*)    All other components within normal limits  URINE CULTURE  GC/CHLAMYDIA PROBE AMP ([place]) NOT AT [facility]    Imaging Review No results found.   EKG Interpretation None      MDM   Final diagnoses:  Urethritis  UTI (lower urinary tract infection)      Scant yellowish discharge on the swab sent for GC and chlamydia. He does have wbc's in his urine with bacteria. Cultures requested. This may be simple cystitis. He was given Rocephin and improved for several days but was not discharged on the edition went about no urinalysis or culture obtained. This may explain why he improved with and recurred. GC chlamydia swab were getting collected. He was given Rocephin and Zithromax. At that Flagyl 2 g by mouth. Discharged with Suprax which would be good coverage for GC, as well as most urinary tract infection floor. Encouraged him to recheck in 48-72 hours if not improving. Within the week with his primary care physician or through [facility] to check on the testing, and urine culture result.  I have encouraged him to  ensure that his partner has been evaluated and treated appropriately.  Rolland PorterMark Valdis Bevill, MD 10/16/14 [phone]

## 2014-10-17 LAB — URINE CULTURE
COLONY COUNT: NO GROWTH
Culture: NO GROWTH

## 2014-10-18 LAB — GC/CHLAMYDIA PROBE AMP (~~LOC~~) NOT AT ARMC
Chlamydia: NEGATIVE
NEISSERIA GONORRHEA: NEGATIVE

## 2015-01-19 ENCOUNTER — Encounter: Payer: Self-pay | Admitting: Family Medicine

## 2015-01-19 ENCOUNTER — Ambulatory Visit: Payer: Self-pay | Admitting: Family Medicine

## 2015-01-19 VITALS — BP 126/83 | HR 89 | Temp 98.3°F | Ht 74.0 in | Wt 337.8 lb

## 2015-01-19 DIAGNOSIS — E119 Type 2 diabetes mellitus without complications: Secondary | ICD-10-CM

## 2015-01-19 LAB — POCT GLYCOSYLATED HEMOGLOBIN (HGB A1C): Hemoglobin A1C: 8.6

## 2015-01-19 MED ORDER — METFORMIN HCL 1000 MG PO TABS: 1000.0000 mg | ORAL_TABLET | Freq: Two times a day (BID) | ORAL | Status: AC

## 2015-01-19 MED ORDER — LINAGLIPTIN 5 MG PO TABS: 5.0000 mg | ORAL_TABLET | Freq: Every day | ORAL | Status: AC

## 2015-01-19 NOTE — Progress Notes (Signed)
BP 126/83 mmHg  Pulse 89  Temp(Src) 98.3 F (36.8 C) (Oral)  Ht _0  (1.88 m)  Wt 337 lb 12.8 oz (153.225 kg)  BMI 43.35 kg/m2   Subjective:    Patient ID: Carl Reid, male    DOB: [birth year], 44 y.o.   MRN: [id number]  HPI: Carl Reid is a 44 y.o. male presenting on 01/19/2015 for Establish Care   HPI Malaise and polydipsia Patient presents today to establish care with our clinic. He used to have a physician down in [place] that he would see for diabetes and then lost a lot of weight at that time and became a diet controlled diabetic. Since that time over the past 4 years he has gained 110 pounds. Today he presents with generalized fatigue and weakness and polydipsia and polyuria. The symptoms have gradually increased, over the past few months. He also had a urinary tract infection in the ER where he had an elevated blood sugar of 220. He denies any tingling or numbness in his feet. He denies any chest pains, shortness of breath, visual disturbances.  Relevant past medical, surgical, family and social history reviewed and updated as indicated. Interim medical history since our last visit reviewed. Allergies and medications reviewed and updated.  Review of Systems  Constitutional: Positive for fatigue. Negative for fever and chills.  HENT: Negative for congestion, ear discharge, ear pain, rhinorrhea and sinus pressure.   Eyes: Negative for discharge and visual disturbance.  Respiratory: Negative for chest tightness, shortness of breath and wheezing.   Cardiovascular: Negative for chest pain and leg swelling.  Gastrointestinal: Negative for abdominal pain, diarrhea and constipation.  Endocrine: Positive for polydipsia and polyuria. Negative for cold intolerance and heat intolerance.  Genitourinary: Negative for difficulty urinating.  Musculoskeletal: Negative for back pain and gait problem.  Skin: Negative for rash.  Neurological: Negative for dizziness, syncope,  light-headedness and headaches.  Psychiatric/Behavioral: Negative for behavioral problems and agitation.  All other systems reviewed and are negative.   Per HPI unless specifically indicated above     Medication List       This list is accurate as of: 01/19/15  4:49 PM.  Always use your most recent med list.               linagliptin 5 MG Tabs tablet  Commonly known as:  TRADJENTA  Take 1 tablet (5 mg total) by mouth daily.     metFORMIN 1000 MG tablet  Commonly known as:  GLUCOPHAGE  Take 1 tablet (1,000 mg total) by mouth 2 (two) times daily with a meal.     naproxen sodium 220 MG tablet  Commonly known as:  ANAPROX  Take 220 mg by mouth as needed.     PROVENTIL HFA 108 (90 BASE) MCG/ACT inhaler  Generic drug:  albuterol  Inhale 2 puffs into the lungs every 4 (four) hours as needed. for wheezing       Social History   Social History  . Marital Status: Single    Spouse Name: N/A  . Number of Children: N/A  . Years of Education: N/A   Occupational History  . Not on file.   Social History Main Topics  . Smoking status: Former Smoker -- 2.00 packs/day    Types: Cigarettes    Start date: 05/15/1987    Quit date: 12/19/2014  . Smokeless tobacco: Not on file  . Alcohol Use: 4.2 oz/week    7 Standard drinks or equivalent per week  .  Drug Use: 0.50 per week    Special: Marijuana  . Sexual Activity:    Partners: Female   Other Topics Concern  . Not on file   Social History Narrative    Past Surgical History  Procedure Laterality Date  . Knee surgery    . Appendectomy      Family History  Problem Relation Age of Onset  . Heart disease Father   . Early death Father   . Mental illness Brother       Objective:    BP 126/83 mmHg  Pulse 89  Temp(Src) 98.3 F (36.8 C) (Oral)  Ht _0  (1.88 m)  Wt 337 lb 12.8 oz (153.225 kg)  BMI 43.35 kg/m2  Wt Readings from Last 3 Encounters:  01/19/15 337 lb 12.8 oz (153.225 kg)  09/24/14 365 lb (165.563 kg)   10/27/08 368 lb 1.6 oz (166.969 kg)    Physical Exam  Constitutional: He is oriented to person, place, and time. He appears well-developed and well-nourished. No distress.  Obese  Eyes: Conjunctivae and EOM are normal. Pupils are equal, round, and reactive to light. Right eye exhibits no discharge. No scleral icterus.  Neck: Neck supple. No thyromegaly present.  Cardiovascular: Normal rate, regular rhythm, normal heart sounds and intact distal pulses.   No murmur heard. Pulmonary/Chest: Effort normal and breath sounds normal. No respiratory distress. He has no wheezes.  Abdominal: He exhibits no distension. There is no tenderness.  Musculoskeletal: Normal range of motion. He exhibits no edema or tenderness.  Lymphadenopathy:    He has no cervical adenopathy.  Neurological: He is alert and oriented to person, place, and time. No cranial nerve deficit. Coordination normal.  Skin: Skin is warm and dry. No rash noted. He is not diaphoretic.  Psychiatric: He has a normal mood and affect. His behavior is normal.  Vitals reviewed.   Results for orders placed or performed in visit on 01/19/15  POCT glycosylated hemoglobin (Hb A1C)  Result Value Ref Range   Hemoglobin A1C 8.6       Assessment & Plan:   Problem List Items Addressed This Visit      Endocrine   Type 2 diabetes mellitus without complication - Primary    Hemoglobin A1c -8.6. Patient has had previous diabetes. He said he lost weight was diet controlled but since gained 110 pounds. We will start on metformin and Tradjenta patient is self pay so we gave him a discount card for Tradjenta. Patient will also return for screening labs. We'll also send to [facility] for education.      Relevant Medications   linagliptin (TRADJENTA) 5 MG TABS tablet   metFORMIN (GLUCOPHAGE) 1000 MG tablet   Other Relevant Orders   POCT glycosylated hemoglobin (Hb A1C) (Completed)   POCT UA - Microalbumin   BMP8+EGFR   Lipid panel       Follow up  plan: Return in about 3 months (around 04/20/2015).  Caryl Pina, MD [facility] 01/19/2015, 4:49 PM

## 2015-01-19 NOTE — Patient Instructions (Signed)
Diabetes and Exercise Exercising regularly is important. It is not just about losing weight. It has many health benefits, such as:  Improving your overall fitness, flexibility, and endurance.  Increasing your bone density.  Helping with weight control.  Decreasing your body fat.  Increasing your muscle strength.  Reducing stress and tension.  Improving your overall health. People with diabetes who exercise gain additional benefits because exercise:  Reduces appetite.  Improves the body's use of blood sugar (glucose).  Helps lower or control blood glucose.  Decreases blood pressure.  Helps control blood lipids (such as cholesterol and triglycerides).  Improves the body's use of the hormone insulin by:  Increasing the body's insulin sensitivity.  Reducing the body's insulin needs.  Decreases the risk for heart disease because exercising:  Lowers cholesterol and triglycerides levels.  Increases the levels of good cholesterol (such as high-density lipoproteins [HDL]) in the body.  Lowers blood glucose levels. YOUR ACTIVITY PLAN  Choose an activity that you enjoy and set realistic goals. Your health care provider or diabetes educator can help you make an activity plan that works for you. Exercise regularly as directed by your health care provider. This includes:  Performing resistance training twice a week such as push-ups, sit-ups, lifting weights, or using resistance bands.  Performing 150 minutes of cardio exercises each week such as walking, running, or playing sports.  Staying active and spending no more than 90 minutes at one time being inactive. Even short bursts of exercise are good for you. Three 10-minute sessions spread throughout the day are just as beneficial as a single 30-minute session. Some exercise ideas include:  Taking the dog for a walk.  Taking the stairs instead of the elevator.  Dancing to your favorite song.  Doing an exercise  video.  Doing your favorite exercise with a friend. RECOMMENDATIONS FOR EXERCISING WITH TYPE 1 OR TYPE 2 DIABETES   Check your blood glucose before exercising. If blood glucose levels are greater than 240 mg/dL, check for urine ketones. Do not exercise if ketones are present.  Avoid injecting insulin into areas of the body that are going to be exercised. For example, avoid injecting insulin into:  The arms when playing tennis.  The legs when jogging.  Keep a record of:  Food intake before and after you exercise.  Expected peak times of insulin action.  Blood glucose levels before and after you exercise.  The type and amount of exercise you have done.  Review your records with your health care provider. Your health care provider will help you to develop guidelines for adjusting food intake and insulin amounts before and after exercising.  If you take insulin or oral hypoglycemic agents, watch for signs and symptoms of hypoglycemia. They include:  Dizziness.  Shaking.  Sweating.  Chills.  Confusion.  Drink plenty of water while you exercise to prevent dehydration or heat stroke. Body water is lost during exercise and must be replaced.  Talk to your health care provider before starting an exercise program to make sure it is safe for you. Remember, almost any type of activity is better than none. Document Released: 07/21/2003 Document Revised: 09/14/2013 Document Reviewed: 10/07/2012 ExitCare Patient Information 2015 [employer]. This information is not intended to replace advice given to you by your health care provider. Make sure you discuss any questions you have with your health care provider.  

## 2015-01-19 NOTE — Assessment & Plan Note (Addendum)
Hemoglobin A1c -8.6. Patient has had previous diabetes. He said he lost weight was diet controlled but since gained 110 pounds. We will start on metformin and Tradjenta patient is self pay so we gave him a discount card for Tradjenta. Patient will also return for screening labs. We'll also send to [facility] for education.

## 2015-01-21 ENCOUNTER — Telehealth: Payer: Self-pay | Admitting: Family Medicine

## 2015-01-21 NOTE — Telephone Encounter (Signed)
Dr. Louanne Skye, medication for diabetes is too expensive, is there something else?

## 2015-01-21 NOTE — Telephone Encounter (Signed)
Spoke with patient, the savings card we gave him does not make the medication $10.00 per month.  The card allows him to pay $10.00 per month and in turn the drug manufacturer discounts the medication.  The medication was originally approximately $400.00 per month and would still cost patient $249.00 per month with the savings card.  He is taking the Metformin but would like [place] to know what else he can do.

## 2015-01-21 NOTE — Telephone Encounter (Signed)
Could call him and see what the issue was, he should of had 10 dollar card for tradjenta? Thanks Arville Care, MD [facility] 01/21/2015, 5:22 PM

## 2015-01-24 NOTE — Telephone Encounter (Signed)
Okay we'll have to send pioglitazone 30 mg daily. If we ordered a 30 day supply and is free [employer] if he has a Research scientist (physical sciences). If not the cheapest on good [email or web address] would be $13 a month for 30 day supply at [facility], or $30 for a 3 month supply at [facility] with the coupon. Also if we start Actos then he will need to check daily blood sugars in the morning. We have strips and a meter in our office that he can come pick up. Give him enough strips to last a month at least. We need to make sure that his morning blood sugars are not crashing down to low and also educate him on what hypoglycemia feels like and to check his blood sugar if he feels those symptoms. Arville Care, MD [facility] 01/24/2015, 9:31 PM

## 2015-01-25 MED ORDER — PIOGLITAZONE HCL 30 MG PO TABS: 30.0000 mg | ORAL_TABLET | Freq: Every day | ORAL | Status: AC

## 2015-01-25 NOTE — Telephone Encounter (Signed)
Patient aware and rx [facility] to [facility]. Patient was also given a meter and test strips. Coupon was also printed out for patient.

## 2015-01-26 ENCOUNTER — Telehealth: Payer: Self-pay | Admitting: Family Medicine

## 2015-01-26 NOTE — Telephone Encounter (Signed)
Dr. Louanne Skye, please advise

## 2015-01-26 NOTE — Telephone Encounter (Signed)
Left message for patient to return call.

## 2015-01-26 NOTE — Telephone Encounter (Signed)
It is okay to wait. Also have him let us know what the program is so we can refer future patients if needed. Arville Care, MD [facility] 01/26/2015, 4:37 PM

## 2015-03-22 ENCOUNTER — Telehealth: Payer: Self-pay | Admitting: Family Medicine

## 2015-03-22 NOTE — Telephone Encounter (Signed)
Patient has been out of medication for over 1 month. He could not afford. I gave patient a months worth of Tradjenta 5mg  and cousin is going to pick up metformin. [facility] [place] [facility] [place] if he can get help with paying for medication. They want to know if there is anything cheaper you could change the tradjenta to? Please advise

## 2015-03-22 NOTE — Telephone Encounter (Signed)
It appears that early October patient and Dr Dettinger filled out paperwork to see if Mr. Carl Reid qualified for assistant from the manufacturer of Tradjenta.  I will check to see if the has been either approved or denied.   Also will need to discuss with the patient assistance program at the Health Department to see what is on their formulary or what they can provide.

## 2015-03-22 NOTE — Telephone Encounter (Signed)
There are no cheaper DPP 4 inhibitors for people who have no insurance if he did not get that assistant program. As I had attempted to send previously he can take the Actos which will be very cheap for him until he can either get insurance or get the assistance programs and I believe that should be free.p

## 2015-03-22 NOTE — Telephone Encounter (Signed)
Please discuss with the clinical pharmacists to see which DPP 4 inhibitor would work the best for this patient and with the county assistance program

## 2015-03-23 NOTE — Telephone Encounter (Signed)
Patient received a months worth of Tradjenta yesterday and should hear next week if he is able to receive help from [place] [facility] [place].

## 2015-03-23 NOTE — Telephone Encounter (Signed)
Paperwork that was filled out was sent to drug manufacturer instead of patient assistance program [employer]. They have been holding his application because they needed proof of income.  Patient has filled out this information for [employer] yesterday.  I have faxed paperwork for [employer] and should hear something in the next 1-2 weeks if patient qualified for assitance.  He has samples to last until then .  If he has not heard anything in 2 weeks he is to call office back so we can look at alternative and get more samples if needed.

## 2015-04-21 ENCOUNTER — Ambulatory Visit: Payer: Self-pay | Admitting: Family Medicine

## 2015-05-15 ENCOUNTER — Emergency Department (HOSPITAL_COMMUNITY)
Admission: EM | Admit: 2015-05-15 | Discharge: 2015-05-16 | Disposition: A | Discharge status: Home / Self Care | Payer: Self-pay | Attending: Emergency Medicine | Admitting: Emergency Medicine

## 2015-05-15 ENCOUNTER — Emergency Department (HOSPITAL_COMMUNITY): Payer: Self-pay

## 2015-05-15 ENCOUNTER — Encounter (HOSPITAL_COMMUNITY): Payer: Self-pay | Admitting: Emergency Medicine

## 2015-05-15 DIAGNOSIS — Z7984 Long term (current) use of oral hypoglycemic drugs: Secondary | ICD-10-CM | POA: Insufficient documentation

## 2015-05-15 DIAGNOSIS — E119 Type 2 diabetes mellitus without complications: Secondary | ICD-10-CM | POA: Insufficient documentation

## 2015-05-15 DIAGNOSIS — R6889 Other general symptoms and signs: Secondary | ICD-10-CM

## 2015-05-15 DIAGNOSIS — R509 Fever, unspecified: Secondary | ICD-10-CM | POA: Insufficient documentation

## 2015-05-15 DIAGNOSIS — Z79899 Other long term (current) drug therapy: Secondary | ICD-10-CM | POA: Insufficient documentation

## 2015-05-15 DIAGNOSIS — Z87891 Personal history of nicotine dependence: Secondary | ICD-10-CM | POA: Insufficient documentation

## 2015-05-15 DIAGNOSIS — J441 Chronic obstructive pulmonary disease with (acute) exacerbation: Secondary | ICD-10-CM | POA: Insufficient documentation

## 2015-05-15 MED ORDER — IPRATROPIUM-ALBUTEROL 0.5-2.5 (3) MG/3ML IN SOLN
3.0000 mL | Freq: Once | RESPIRATORY_TRACT | Status: AC
  Administered 2015-05-16: 3 mL via RESPIRATORY_TRACT
  Filled 2015-05-15: qty 3

## 2015-05-15 MED ORDER — SODIUM CHLORIDE 0.9 % IV BOLUS (SEPSIS)
1000.0000 mL | Freq: Once | INTRAVENOUS | Status: AC
  Administered 2015-05-16: 1000 mL via INTRAVENOUS

## 2015-05-15 MED ORDER — ALBUTEROL SULFATE (2.5 MG/3ML) 0.083% IN NEBU
2.5000 mg | INHALATION_SOLUTION | Freq: Once | RESPIRATORY_TRACT | Status: AC
  Administered 2015-05-16: 2.5 mg via RESPIRATORY_TRACT
  Filled 2015-05-15: qty 3

## 2015-05-15 MED ORDER — PREDNISONE 50 MG PO TABS
60.0000 mg | ORAL_TABLET | Freq: Once | ORAL | Status: AC
  Administered 2015-05-16: 60 mg via ORAL
  Filled 2015-05-15: qty 1

## 2015-05-15 MED ORDER — ACETAMINOPHEN 325 MG PO TABS
650.0000 mg | ORAL_TABLET | Freq: Once | ORAL | Status: AC
  Administered 2015-05-16: 650 mg via ORAL
  Filled 2015-05-15: qty 2

## 2015-05-15 NOTE — ED Provider Notes (Signed)
CSN: [id number]     Arrival date & time 05/15/15  2246 History  By signing my name below, I, Budd Palmer, attest that this documentation has been prepared under the direction and in the presence of Zadie Rhine, MD. Electronically Signed: Budd Palmer, ED Scribe. 05/15/2015. 11:59 PM.      Chief Complaint  Patient presents with  . Cough   Patient is a 45 y.o. male presenting with cough. The history is provided by the patient and the spouse. No language interpreter was used.  Cough Severity:  Moderate Onset quality:  Gradual Duration:  2 days Timing:  Constant Progression:  Unchanged Chronicity:  New Smoker: yes   Relieved by:  Nothing Associated symptoms: chest pain, fever, myalgias, shortness of breath and sinus congestion   Chest pain:    Quality:  Aching   Timing:  Sporadic   Chronicity:  New Fever:    Temp source:  Subjective Myalgias:    Location:  Generalized   Onset quality:  Gradual   Duration:  2 days   Timing:  Constant Shortness of breath:    Severity:  Mild   Onset quality:  Gradual   Duration:  2 days  HPI Comments: Carl Reid is a 45 y.o. male smoker with a PMHx of DM and COPD who presents to the Emergency Department complaining of a cough onset 2 days ago. He reports associated congestion, SOB, fever, myalgias, post-tussive chest pain, and post-tussive abdominal pain. Per wife, pt will cry out in pain after coughing both while awake and in his sleep. 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 He notes he is trying to quit smoking. He denies a PMHx of DVT or PE, no h/o CAD. Pt denies n/v/d, back pain, and dysuria.   Past Medical History  Diagnosis Date  . Diabetes mellitus without complication (HCC)   . COPD (chronic obstructive pulmonary disease) [facility])    Past Surgical History  Procedure Laterality Date  . Knee surgery    . Appendectomy     Family History  Problem Relation Age of Onset  . Heart  disease Father   . Early death Father   . Mental illness Brother    Social History  Substance Use Topics  . Smoking status: Former Smoker -- 2.00 packs/day    Types: Cigarettes    Start date: 05/15/1987    Quit date: 12/19/2014  . Smokeless tobacco: None  . Alcohol Use: 4.2 oz/week    7 Standard drinks or equivalent per week    Review of Systems  Constitutional: Positive for fever.  HENT: Positive for congestion.   Respiratory: Positive for cough and shortness of breath.   Cardiovascular: Positive for chest pain.  Gastrointestinal: Positive for abdominal pain. Negative for nausea, vomiting and diarrhea.  Genitourinary: Negative for dysuria.  Musculoskeletal: Positive for myalgias. Negative for back pain.  All other systems reviewed and are negative.   Allergies  Review of patient's allergies indicates no known allergies.  Home Medications   Prior to Admission medications   Medication Sig Start Date End Date Taking? Authorizing Provider  albuterol (PROVENTIL HFA) 108 (90 BASE) MCG/ACT inhaler Inhale 2 puffs into the lungs every 4 (four) hours as needed. for wheezing     Historical Provider, MD  linagliptin (TRADJENTA) 5 MG TABS tablet Take 1 tablet (5 mg total) by mouth daily. 01/19/15   Elige Radon Dettinger, MD  metFORMIN (GLUCOPHAGE) 1000 MG tablet  Take 1 tablet (1,000 mg total) by mouth 2 (two) times daily with a meal. 01/19/15   Elige Radon Dettinger, MD  naproxen sodium (ANAPROX) 220 MG tablet Take 220 mg by mouth as needed.    Historical Provider, MD  pioglitazone (ACTOS) 30 MG tablet Take 1 tablet (30 mg total) by mouth daily. 01/25/15   Elige Radon Dettinger, MD   BP 131/99 mmHg  Pulse 122  Temp(Src) 100.2 F (37.9 C) (Temporal)  Resp 20  Ht 6\' 2"  (1.88 m)  Wt 340 lb (154.223 kg)  BMI 43.63 kg/m2  SpO2 93% Physical Exam CONSTITUTIONAL: Well developed/well nourished HEAD: Normocephalic/atraumatic EYES: EOMI/PERRL ENMT: Mucous membranes moist NECK: supple no meningeal  signs SPINE/BACK:entire spine nontender CV: S1/S2 noted, no murmurs/rubs/gallops noted LUNGS: expiratory wheezing bilaterally, no distress noted Chest - mild tenderness to left lower chest ABDOMEN: soft, nontender, no rebound or guarding, bowel sounds noted throughout abdomen GU:no cva tenderness NEURO: Pt is awake/alert/appropriate, moves all extremitiesx4.  No facial droop.   EXTREMITIES: pulses normal/equal, full ROM SKIN: warm, color normal PSYCH: no abnormalities of mood noted, alert and oriented to situation  ED Course  Procedures  DIAGNOSTIC STUDIES: Oxygen Saturation is 96% on RA, adequate by my interpretation.    COORDINATION OF CARE: 11:46 PM - Discussed plans to order a breathing treatment and IV fluids. Will order a chest XR to r/o PNA. Pt advised of plan for treatment and pt agrees. 1:44 AM Pt improved His work of breathing is improved His vitals improved He ambulated without difficulty and no hypoxia He is not septic appearing Lactate mildly elevated but he was given IV fluids He would like to go home Suspect viral illness that triggered COPD exacerbation He may have influenza and I offered to prescribe tamiflu - he is at high risk for flu complications due to his medical history.  He declines tamiflu due to cost Also - advised to monitor glucose at home (it was elevated here) as he will be starting prednisone BP 122/84 mmHg  Pulse 116  Temp(Src) 100.2 F (37.9 C) (Temporal)  Resp 18  Ht 6\' 2"  (1.88 m)  Wt 154.223 kg  BMI 43.63 kg/m2  SpO2 97% We discussed strict ER return precautions  Labs Review Labs Reviewed  BASIC METABOLIC PANEL - Abnormal; Notable for the following:    Sodium 133 (*)    Chloride 100 (*)    Glucose, Bld 339 (*)    All other components within normal limits  CBC WITH DIFFERENTIAL/PLATELET - Abnormal; Notable for the following:    Platelets 118 (*)    All other components within normal limits  I-STAT CG4 LACTIC ACID, ED - Abnormal;  Notable for the following:    Lactic Acid, Venous 2.37 (*)    All other components within normal limits    Imaging Review Dg Chest 2 View  05/16/2015  CLINICAL DATA:  Acute onset of cough, shortness of breath, fever and myalgias. Generalized chest and abdominal pain. Initial encounter. EXAM: CHEST  2 VIEW COMPARISON:  Chest radiograph performed 05/24/2014 FINDINGS: The lungs are well-aerated. Mild peribronchial thickening is noted. There is no evidence of focal opacification, pleural effusion or pneumothorax. The heart is normal in size; the mediastinal contour is within normal limits. No acute osseous abnormalities are seen. IMPRESSION: Mild peribronchial thickening.  Lungs otherwise clear Electronically Signed   By: Roanna Raider M.D.   On: 05/16/2015 00:18   I have personally reviewed and evaluated these images and lab results as part  of my medical decision-making.   Medications  sodium chloride 0.9 % bolus 1,000 mL (0 mLs Intravenous Stopped 05/16/15 0128)  ipratropium-albuterol (DUONEB) 0.5-2.5 (3) MG/3ML nebulizer solution 3 mL (3 mLs Nebulization Given 05/16/15 0017)  albuterol (PROVENTIL) (2.5 MG/3ML) 0.083% nebulizer solution 2.5 mg (2.5 mg Nebulization Given 05/16/15 0017)  predniSONE (DELTASONE) tablet 60 mg (60 mg Oral Given 05/16/15 0034)  acetaminophen (TYLENOL) tablet 650 mg (650 mg Oral Given 05/16/15 0034)  albuterol (PROVENTIL) (2.5 MG/3ML) 0.083% nebulizer solution 5 mg (5 mg Nebulization Given 05/16/15 0043)    MDM   Final diagnoses:  Chronic obstructive pulmonary disease with acute exacerbation (HCC)  Flu-like symptoms    Nursing notes including past medical history and social history reviewed and considered in documentation Labs/vital reviewed myself and considered during evaluation xrays/imaging reviewed by myself and considered during evaluation   I personally performed the services described in this documentation, which was scribed in my presence. The recorded  information has been reviewed and is accurate.       Zadie Rhineonald Mattisyn Cardona, MD 05/16/15 [phone]

## 2015-05-15 NOTE — ED Notes (Signed)
Cough and congestion since Friday, increase sob, rib pain

## 2015-05-16 LAB — BASIC METABOLIC PANEL
ANION GAP: 9 (ref 5–15)
BUN: 15 mg/dL (ref 6–20)
CO2: 24 mmol/L (ref 22–32)
Calcium: 9.1 mg/dL (ref 8.9–10.3)
Chloride: 100 mmol/L — ABNORMAL LOW (ref 101–111)
Creatinine, Ser: 1.05 mg/dL (ref 0.61–1.24)
Glucose, Bld: 339 mg/dL — ABNORMAL HIGH (ref 65–99)
Potassium: 3.8 mmol/L (ref 3.5–5.1)
SODIUM: 133 mmol/L — AB (ref 135–145)

## 2015-05-16 LAB — CBC WITH DIFFERENTIAL/PLATELET
BASOS ABS: 0 10*3/uL (ref 0.0–0.1)
BASOS PCT: 0 %
Eosinophils Absolute: 0.1 10*3/uL (ref 0.0–0.7)
Eosinophils Relative: 2 %
HEMATOCRIT: 44.7 % (ref 39.0–52.0)
HEMOGLOBIN: 16.1 g/dL (ref 13.0–17.0)
Lymphocytes Relative: 18 %
Lymphs Abs: 1.2 10*3/uL (ref 0.7–4.0)
MCH: 31.7 pg (ref 26.0–34.0)
MCHC: 36 g/dL (ref 30.0–36.0)
MCV: 88 fL (ref 78.0–100.0)
Monocytes Absolute: 0.8 10*3/uL (ref 0.1–1.0)
Monocytes Relative: 12 %
NEUTROS ABS: 4.6 10*3/uL (ref 1.7–7.7)
Neutrophils Relative %: 69 %
Platelets: 118 10*3/uL — ABNORMAL LOW (ref 150–400)
RBC: 5.08 MIL/uL (ref 4.22–5.81)
RDW: 13.5 % (ref 11.5–15.5)
WBC: 6.7 10*3/uL (ref 4.0–10.5)

## 2015-05-16 LAB — I-STAT CG4 LACTIC ACID, ED: Lactic Acid, Venous: 2.37 mmol/L (ref 0.5–2.0)

## 2015-05-16 MED ORDER — PREDNISONE 50 MG PO TABS: ORAL_TABLET | ORAL | Status: AC

## 2015-05-16 MED ORDER — ALBUTEROL SULFATE HFA 108 (90 BASE) MCG/ACT IN AERS: 1.0000 | INHALATION_SPRAY | Freq: Four times a day (QID) | RESPIRATORY_TRACT | Status: AC | PRN

## 2015-05-16 MED ORDER — ALBUTEROL SULFATE (2.5 MG/3ML) 0.083% IN NEBU
5.0000 mg | INHALATION_SOLUTION | Freq: Once | RESPIRATORY_TRACT | Status: AC
  Administered 2015-05-16: 5 mg via RESPIRATORY_TRACT
  Filled 2015-05-16: qty 6

## 2015-05-16 NOTE — ED Notes (Signed)
Patient ambulated, made two laps around nursing station. Patient O2 sat 94 to 100. Patient states that he feels like he can breath better being up. Patient states that he is having pain in rib cage area. Patient's wife wants EDP to give him IV Tylenol.

## 2015-05-25 ENCOUNTER — Telehealth: Payer: Self-pay | Admitting: *Deleted

## 2015-05-25 NOTE — Telephone Encounter (Signed)
He is still coughing some but can't take off work to follow up at present.  He agreed to call us if no further improvement.

## 2015-06-15 ENCOUNTER — Telehealth: Payer: Self-pay | Admitting: Family Medicine

## 2015-08-19 ENCOUNTER — Telehealth: Payer: Self-pay | Admitting: Family Medicine

## 2015-11-21 ENCOUNTER — Telehealth: Payer: Self-pay | Admitting: Family Medicine

## 2023-11-29 ENCOUNTER — Encounter: Payer: Self-pay | Admitting: Advanced Practice Midwife
